# Patient Record
Sex: Male | Born: 1990 | Race: Black or African American | Hispanic: No | Marital: Single | Smoking: Current every day smoker
Health system: Southern US, Community
[De-identification: ages and names within clinical notes are randomized; demographics above are authoritative.]

---

## 2020-06-25 ENCOUNTER — Emergency Department (HOSPITAL_COMMUNITY): Payer: PRIVATE HEALTH INSURANCE

## 2020-06-25 ENCOUNTER — Emergency Department (HOSPITAL_COMMUNITY)
Admission: EM | Admit: 2020-06-25 | Discharge: 2020-06-26 | Disposition: A | Discharge status: Home / Self Care | Payer: PRIVATE HEALTH INSURANCE | Attending: Emergency Medicine | Admitting: Emergency Medicine

## 2020-06-25 ENCOUNTER — Other Ambulatory Visit: Payer: Self-pay

## 2020-06-25 DIAGNOSIS — M545 Low back pain, unspecified: Secondary | ICD-10-CM | POA: Insufficient documentation

## 2020-06-25 DIAGNOSIS — U071 COVID-19: Secondary | ICD-10-CM | POA: Insufficient documentation

## 2020-06-25 DIAGNOSIS — R509 Fever, unspecified: Secondary | ICD-10-CM | POA: Diagnosis present

## 2020-06-25 DIAGNOSIS — R059 Cough, unspecified: Secondary | ICD-10-CM

## 2020-06-25 LAB — RESP PANEL BY RT-PCR (FLU A&B, COVID) ARPGX2
Influenza A by PCR: NEGATIVE
Influenza B by PCR: NEGATIVE
SARS Coronavirus 2 by RT PCR: POSITIVE — AB

## 2020-06-25 MED ORDER — ACETAMINOPHEN 325 MG PO TABS
650.0000 mg | ORAL_TABLET | Freq: Once | ORAL | Status: AC | PRN
  Administered 2020-06-25: 650 mg via ORAL
  Filled 2020-06-25: qty 2

## 2020-06-25 NOTE — ED Triage Notes (Signed)
/  o fever, headache. Symptoms started today.

## 2020-06-26 ENCOUNTER — Encounter (HOSPITAL_COMMUNITY): Payer: Self-pay | Admitting: Student

## 2020-06-26 MED ORDER — ACETAMINOPHEN 500 MG PO TABS
1000.0000 mg | ORAL_TABLET | Freq: Once | ORAL | Status: AC
  Administered 2020-06-26: 1000 mg via ORAL
  Filled 2020-06-26: qty 2

## 2020-06-26 MED ORDER — KETOROLAC TROMETHAMINE 15 MG/ML IJ SOLN
60.0000 mg | Freq: Once | INTRAMUSCULAR | Status: AC
  Administered 2020-06-26: 60 mg via INTRAMUSCULAR
  Filled 2020-06-26: qty 4

## 2020-06-26 MED ORDER — BENZONATATE 100 MG PO CAPS: 100.0000 mg | ORAL_CAPSULE | Freq: Three times a day (TID) | ORAL | 0 refills | Status: AC

## 2020-06-26 MED ORDER — NAPROXEN 500 MG PO TABS: 500.0000 mg | ORAL_TABLET | Freq: Two times a day (BID) | ORAL | 0 refills | Status: AC | PRN

## 2020-06-26 NOTE — ED Provider Notes (Signed)
[facility] EMERGENCY DEPARTMENT Provider Note   CSN: [id number] Arrival date & time: 06/25/20  1758     History Chief Complaint  Patient presents with  . Fever  . Headache    Norman Levy is a 30 y.o. male who presents to the emergency department with complaints of fever that began yesterday.  Patient states he has felt poorly with fever, chills, headache, sore throat, as well as generalized body ache/soreness that is especially worse in his lower back.  Relays his breathing seems somewhat heavy but not specifically short of breath. No alleviating or aggravating factors to his symptoms. Denies N/V/D, abdominal pain, syncope, leg swelling, hemoptysis, recent surgery/trauma, recent long travel, hormone use, personal hx of cancer, or hx of DVT/PE.  Denies visual disturbance, numbness, tingling, weakness, or dizziness like the room spinning.   HPI     No past medical history on file.  There are no problems to display for this patient.   History reviewed. No pertinent surgical history.     No family history on file.     Home Medications Prior to Admission medications   Not on File    Allergies    Patient has no known allergies.  Review of Systems   Review of Systems  Constitutional: Positive for chills and fever.  HENT: Positive for sore throat.   Eyes: Negative for visual disturbance.  Respiratory: Positive for cough (minimal) and shortness of breath.   Cardiovascular: Negative for leg swelling.  Gastrointestinal: Negative for abdominal pain, diarrhea, nausea and vomiting.  Musculoskeletal: Positive for arthralgias and myalgias.  Neurological: Positive for headaches. Negative for dizziness, seizures, syncope, speech difficulty, weakness and numbness.    Physical Exam Updated Vital Signs BP (!) 142/88 (BP Location: Right Arm)   Pulse 88   Temp (!) 101.2 F (38.4 C) (Oral) Comment: RN aware  Resp 18   Ht 5\' 11"  (1.803 m)   Wt 104.3 kg    SpO2 95%   BMI 32.08 kg/m   Physical Exam Vitals and nursing note reviewed.  Constitutional:      General: He is not in acute distress.    Appearance: He is well-developed. He is not toxic-appearing.  HENT:     Head: Normocephalic and atraumatic.     Right Ear: Ear canal normal. Tympanic membrane is not perforated, erythematous, retracted or bulging.     Left Ear: Ear canal normal. Tympanic membrane is not perforated, erythematous, retracted or bulging.     Ears:     Comments: No mastoid erythema/swellng/tenderness.     Nose:     Right Sinus: No maxillary sinus tenderness or frontal sinus tenderness.     Left Sinus: No maxillary sinus tenderness or frontal sinus tenderness.     Mouth/Throat:     Pharynx: Oropharynx is clear. Uvula midline. No oropharyngeal exudate or posterior oropharyngeal erythema.     Comments: Posterior oropharynx is symmetric appearing. Patient tolerating own secretions without difficulty. No trismus. No drooling. No hot potato voice. No swelling beneath the tongue, submandibular compartment is soft.  Eyes:     General:        Right eye: No discharge.        Left eye: No discharge.     Conjunctiva/sclera: Conjunctivae normal.  Cardiovascular:     Rate and Rhythm: Normal rate and regular rhythm.  Pulmonary:     Effort: Pulmonary effort is normal. No respiratory distress.     Breath sounds: Normal breath sounds. No wheezing,  rhonchi or rales.  Abdominal:     General: There is no distension.     Palpations: Abdomen is soft.     Tenderness: There is no abdominal tenderness.  Musculoskeletal:     Cervical back: Neck supple. No rigidity.     Comments: No lower extremity edema.  Lymphadenopathy:     Cervical: No cervical adenopathy.  Skin:    General: Skin is warm and dry.     Findings: No rash.  Neurological:     Mental Status: He is alert.     Comments: Alert.  Clear speech.  CN III to XII grossly intact.  Sensation and strength grossly intact bilateral  upper and lower extremities.  Patient is ambulatory.  Psychiatric:        Behavior: Behavior normal.     ED Results / Procedures / Treatments   Labs (all labs ordered are listed, but only abnormal results are displayed) Labs Reviewed  RESP PANEL BY RT-PCR (FLU A&B, COVID) ARPGX2 - Abnormal; Notable for the following components:      Result Value   SARS Coronavirus 2 by RT PCR POSITIVE (*)    All other components within normal limits    EKG None  Radiology DG Chest Port 1 View  Result Date: 06/25/2020 CLINICAL DATA:  Cough and fever EXAM: PORTABLE CHEST 1 VIEW COMPARISON:  None. FINDINGS: Lungs are clear. Heart size and pulmonary vascularity are normal. No adenopathy. No bone lesions. IMPRESSION: Lungs clear.  Cardiac silhouette normal. Electronically Signed   By: Lowella Grip III M.D.   On: 06/25/2020 20:30    Procedures Procedures (including critical care time)  Medications Ordered in ED Medications  acetaminophen (TYLENOL) tablet 1,000 mg (has no administration in time range)  ketorolac (TORADOL) 15 MG/ML injection 60 mg (has no administration in time range)  acetaminophen (TYLENOL) tablet 650 mg (650 mg Oral Given 06/25/20 1849)    ED Course  I have reviewed the triage vital signs and the nursing notes.  Pertinent labs & imaging results that were available during my care of the patient were reviewed by me and considered in my medical decision making (see chart for details).    Norman Levy was evaluated in Emergency Department on 06/26/2020 for the symptoms described in the history of present illness. He/she was evaluated in the context of the global COVID-19 pandemic, which necessitated consideration that the patient might be at risk for infection with the SARS-CoV-2 virus that causes COVID-19. Institutional protocols and algorithms that pertain to the evaluation of patients at risk for COVID-19 are in a state of rapid change based on information released by regulatory  bodies including the CDC and federal and state organizations. These policies and algorithms were followed during the patient's care in the ED.  MDM Rules/Calculators/A&P                         Patient presents to the ED with complaints of generally not feeling well since yesterday. Nontoxic, febrile, BP somewhat elevated- doubt HTN emergency.  Will give Tylenol for fever as well as her pain and Toradol for additional pain control.  Additional history obtained:  Additional history obtained from nursing note review.   Lab Tests:  I reviewed and interpreted labs, which included:  COVID testing: Positive.   Imaging Studies ordered:  CXR ordered per triage, I independently visualized and interpreted imaging which showed no acute process.   ED Course:  Exam is without signs of  AOM, AOE, or mastoiditis. Oropharyngeal exam is benign.. No sinus tenderness. No meningeal signs. No neuro deficits. Lungs are CTA without focal adventitious sounds, no signs of increased work of breathing, CXR without infiltrate, doubt CAP. Abdomen nontender w/o peritoneal signs. No signs of respiratory distress, not hypoxic.  Suspect patient is symptomatic from COVID-19.  Appears appropriate for discharge home with supportive care. I discussed results, treatment plan, need for follow-up, and return precautions with the patient. Provided opportunity for questions, patient confirmed understanding and is in agreement with plan.   Portions of this note were generated with Scientist, clinical (histocompatibility and immunogenetics). Dictation errors may occur despite best attempts at proofreading.   Final Clinical Impression(s) / ED Diagnoses Final diagnoses:  COVID-19    Rx / DC Orders ED Discharge Orders         Ordered    benzonatate (TESSALON) 100 MG capsule  Every 8 hours        06/26/20 0628    naproxen (NAPROSYN) 500 MG tablet  2 times daily PRN        06/26/20 0628           Cherly Anderson, PA-C 06/26/20 1587    Sabas Sous, MD 06/26/20 [phone]

## 2020-06-26 NOTE — Discharge Instructions (Signed)
Your COVID-19 test was positive, we suspect this is the cause of your symptoms.  We are sending home the following medicines: -Tessalon: Take every hours as needed for coughing  - Naproxen is a nonsteroidal anti-inflammatory medication that will help with pain and swelling. Be sure to take this medication as prescribed with food, 1 pill every 12 hours,  It should be taken with food, as it can cause stomach upset, and more seriously, stomach bleeding. Do not take other nonsteroidal anti-inflammatory medications with this such as Advil, Motrin, Aleve, Mobic, Goodie Powder, or Motrin.    You make take Tylenol per over the counter dosing with these medications for pain & fevers.    We have prescribed you new medication(s) today. Discuss the medications prescribed today with your pharmacist as they can have adverse effects and interactions with your other medicines including over the counter and prescribed medications. Seek medical evaluation if you start to experience new or abnormal symptoms after taking one of these medicines, seek care immediately if you start to experience difficulty breathing, feeling of your throat closing, facial swelling, or rash as these could be indications of a more serious allergic reaction  We are instructing patient's with COVID 19 or symptoms of COVID 19 to follow the below instructions regardless of vaccination status:  - Stay home for 5 days. - If you have no symptoms or your symptoms are resolving after 5 days, you can leave your house--> Continue to wear a mask around others for 5 additional days. - If you have a fever, continue to stay home until your fever resolves.  Please follow up with primary care within 3-5 days for re-evaluation- call prior to going to the office to make them aware of your symptoms as some offices are altering their method of seeing patients with COVID 19 symptoms, we have also provided our [place] covid clinic for follow up as well.  Return to  the ER for new or worsening symptoms including but not limited to increased work of breathing, chest pain, passing out, coughing up blood, or any other concerns.       Person Under Monitoring Name: Norman Levy  Location: [address] [place] [address]   Infection Prevention Recommendations for Individuals Confirmed to have, or Being Evaluated for, 2019 Novel Coronavirus (COVID-19) Infection Who Receive Care at Home  Individuals who are confirmed to have, or are being evaluated for, COVID-19 should follow the prevention steps below until a healthcare provider or local or state health department says they can return to normal activities.  Stay home except to get medical care You should restrict activities outside your home, except for getting medical care. Do not go to work, school, or public areas, and do not use public transportation or taxis.  Call ahead before visiting your doctor Before your medical appointment, call the healthcare provider and tell them that you have, or are being evaluated for, COVID-19 infection. This will help the healthcare providers office take steps to keep other people from getting infected. Ask your healthcare provider to call the local or state health department.  Monitor your symptoms Seek prompt medical attention if your illness is worsening (e.g., difficulty breathing). Before going to your medical appointment, call the healthcare provider and tell them that you have, or are being evaluated for, COVID-19 infection. Ask your healthcare provider to call the local or state health department.  Wear a facemask You should wear a facemask that covers your nose and mouth when you are  in the same room with other people and when you visit a healthcare provider. People who live with or visit you should also wear a facemask while they are in the same room with you.  Separate yourself from other people in your home As much as possible, you should  stay in a different room from other people in your home. Also, you should use a separate bathroom, if available.  Avoid sharing household items You should not share dishes, drinking glasses, cups, eating utensils, towels, bedding, or other items with other people in your home. After using these items, you should wash them thoroughly with soap and water.  Cover your coughs and sneezes Cover your mouth and nose with a tissue when you cough or sneeze, or you can cough or sneeze into your sleeve. Throw used tissues in a lined trash can, and immediately wash your hands with soap and water for at least 20 seconds or use an alcohol-based hand rub.  Wash your [facility] your hands often and thoroughly with soap and water for at least 20 seconds. You can use an alcohol-based hand sanitizer if soap and water are not available and if your hands are not visibly dirty. Avoid touching your eyes, nose, and mouth with unwashed hands.   Prevention Steps for Caregivers and Household Members of Individuals Confirmed to have, or Being Evaluated for, COVID-19 Infection Being Cared for in the Home  If you live with, or provide care at home for, a person confirmed to have, or being evaluated for, COVID-19 infection please follow these guidelines to prevent infection:  Follow healthcare providers instructions Make sure that you understand and can help the patient follow any healthcare provider instructions for all care.  Provide for the patients basic needs You should help the patient with basic needs in the home and provide support for getting groceries, prescriptions, and other personal needs.  Monitor the patients symptoms If they are getting sicker, call his or her medical provider and tell them that the patient has, or is being evaluated for, COVID-19 infection. This will help the healthcare providers office take steps to keep other people from getting infected. Ask the healthcare provider to call  the local or state health department.  Limit the number of people who have contact with the patient If possible, have only one caregiver for the patient. Other household members should stay in another home or place of residence. If this is not possible, they should stay in another room, or be separated from the patient as much as possible. Use a separate bathroom, if available. Restrict visitors who do not have an essential need to be in the home.  Keep older adults, very young children, and other sick people away from the patient Keep older adults, very young children, and those who have compromised immune systems or chronic health conditions away from the patient. This includes people with chronic heart, lung, or kidney conditions, diabetes, and cancer.  Ensure good ventilation Make sure that shared spaces in the home have good air flow, such as from an air conditioner or an opened window, weather permitting.  Wash your hands often Wash your hands often and thoroughly with soap and water for at least 20 seconds. You can use an alcohol based hand sanitizer if soap and water are not available and if your hands are not visibly dirty. Avoid touching your eyes, nose, and mouth with unwashed hands. Use disposable paper towels to dry your hands. If not available, use dedicated  cloth towels and replace them when they become wet.  Wear a facemask and gloves Wear a disposable facemask at all times in the room and gloves when you touch or have contact with the patients blood, body fluids, and/or secretions or excretions, such as sweat, saliva, sputum, nasal mucus, vomit, urine, or feces.  Ensure the mask fits over your nose and mouth tightly, and do not touch it during use. Throw out disposable facemasks and gloves after using them. Do not reuse. Wash your hands immediately after removing your facemask and gloves. If your personal clothing becomes contaminated, carefully remove clothing and launder.  Wash your hands after handling contaminated clothing. Place all used disposable facemasks, gloves, and other waste in a lined container before disposing them with other household waste. Remove gloves and wash your hands immediately after handling these items.  Do not share dishes, glasses, or other household items with the patient Avoid sharing household items. You should not share dishes, drinking glasses, cups, eating utensils, towels, bedding, or other items with a patient who is confirmed to have, or being evaluated for, COVID-19 infection. After the person uses these items, you should wash them thoroughly with soap and water.  Wash laundry thoroughly Immediately remove and wash clothes or bedding that have blood, body fluids, and/or secretions or excretions, such as sweat, saliva, sputum, nasal mucus, vomit, urine, or feces, on them. Wear gloves when handling laundry from the patient. Read and follow directions on labels of laundry or clothing items and detergent. In general, wash and dry with the warmest temperatures recommended on the label.  Clean all areas the individual has used often Clean all touchable surfaces, such as counters, tabletops, doorknobs, bathroom fixtures, toilets, phones, keyboards, tablets, and bedside tables, every day. Also, clean any surfaces that may have blood, body fluids, and/or secretions or excretions on them. Wear gloves when cleaning surfaces the patient has come in contact with. Use a diluted bleach solution (e.g., dilute bleach with 1 part bleach and 10 parts water) or a household disinfectant with a label that says EPA-registered for coronaviruses. To make a bleach solution at home, add 1 tablespoon of bleach to 1 quart (4 cups) of water. For a larger supply, add  cup of bleach to 1 gallon (16 cups) of water. Read labels of cleaning products and follow recommendations provided on product labels. Labels contain instructions for safe and effective use of the  cleaning product including precautions you should take when applying the product, such as wearing gloves or eye protection and making sure you have good ventilation during use of the product. Remove gloves and wash hands immediately after cleaning.  Monitor yourself for signs and symptoms of illness Caregivers and household members are considered close contacts, should monitor their health, and will be asked to limit movement outside of the home to the extent possible. Follow the monitoring steps for close contacts listed on the symptom monitoring form.   ? If you have additional questions, contact your local health department or call the epidemiologist on call at [phone] (available 24/7). ? This guidance is subject to change. For the most up-to-date guidance from [facility], please refer to their website: [email or web address]

## 2020-07-23 ENCOUNTER — Other Ambulatory Visit: Payer: Self-pay

## 2020-07-23 ENCOUNTER — Ambulatory Visit (HOSPITAL_COMMUNITY)
Admission: EM | Admit: 2020-07-23 | Discharge: 2020-07-23 | Disposition: A | Discharge status: Home / Self Care | Payer: PRIVATE HEALTH INSURANCE | Attending: Family Medicine | Admitting: Family Medicine

## 2020-07-23 ENCOUNTER — Encounter (HOSPITAL_COMMUNITY): Payer: Self-pay | Admitting: *Deleted

## 2020-07-23 DIAGNOSIS — M25531 Pain in right wrist: Secondary | ICD-10-CM

## 2020-07-23 MED ORDER — PREDNISONE 20 MG PO TABS: 40.0000 mg | ORAL_TABLET | Freq: Every day | ORAL | 0 refills | Status: AC

## 2020-07-23 MED ORDER — CYCLOBENZAPRINE HCL 10 MG PO TABS: 10.0000 mg | ORAL_TABLET | Freq: Three times a day (TID) | ORAL | 0 refills | Status: AC | PRN
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
## 2020-07-30 ENCOUNTER — Ambulatory Visit: Payer: PRIVATE HEALTH INSURANCE | Admitting: Surgical

## 2020-07-31 ENCOUNTER — Ambulatory Visit (INDEPENDENT_AMBULATORY_CARE_PROVIDER_SITE_OTHER): Payer: Self-pay

## 2020-07-31 ENCOUNTER — Ambulatory Visit: Payer: PRIVATE HEALTH INSURANCE | Admitting: Orthopedic Surgery

## 2020-07-31 ENCOUNTER — Ambulatory Visit: Payer: PRIVATE HEALTH INSURANCE | Admitting: Surgical

## 2020-07-31 DIAGNOSIS — M25531 Pain in right wrist: Secondary | ICD-10-CM

## 2020-08-08 ENCOUNTER — Encounter: Payer: Self-pay | Admitting: Orthopedic Surgery
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
## 2022-11-28 IMAGING — DX DG CHEST 1V PORT
1 series · 1 of 1 positions shown · non-contrast
Comparison: None.

CLINICAL DATA: Cough and fever

EXAM:
PORTABLE CHEST 1 VIEW

[chest ap]
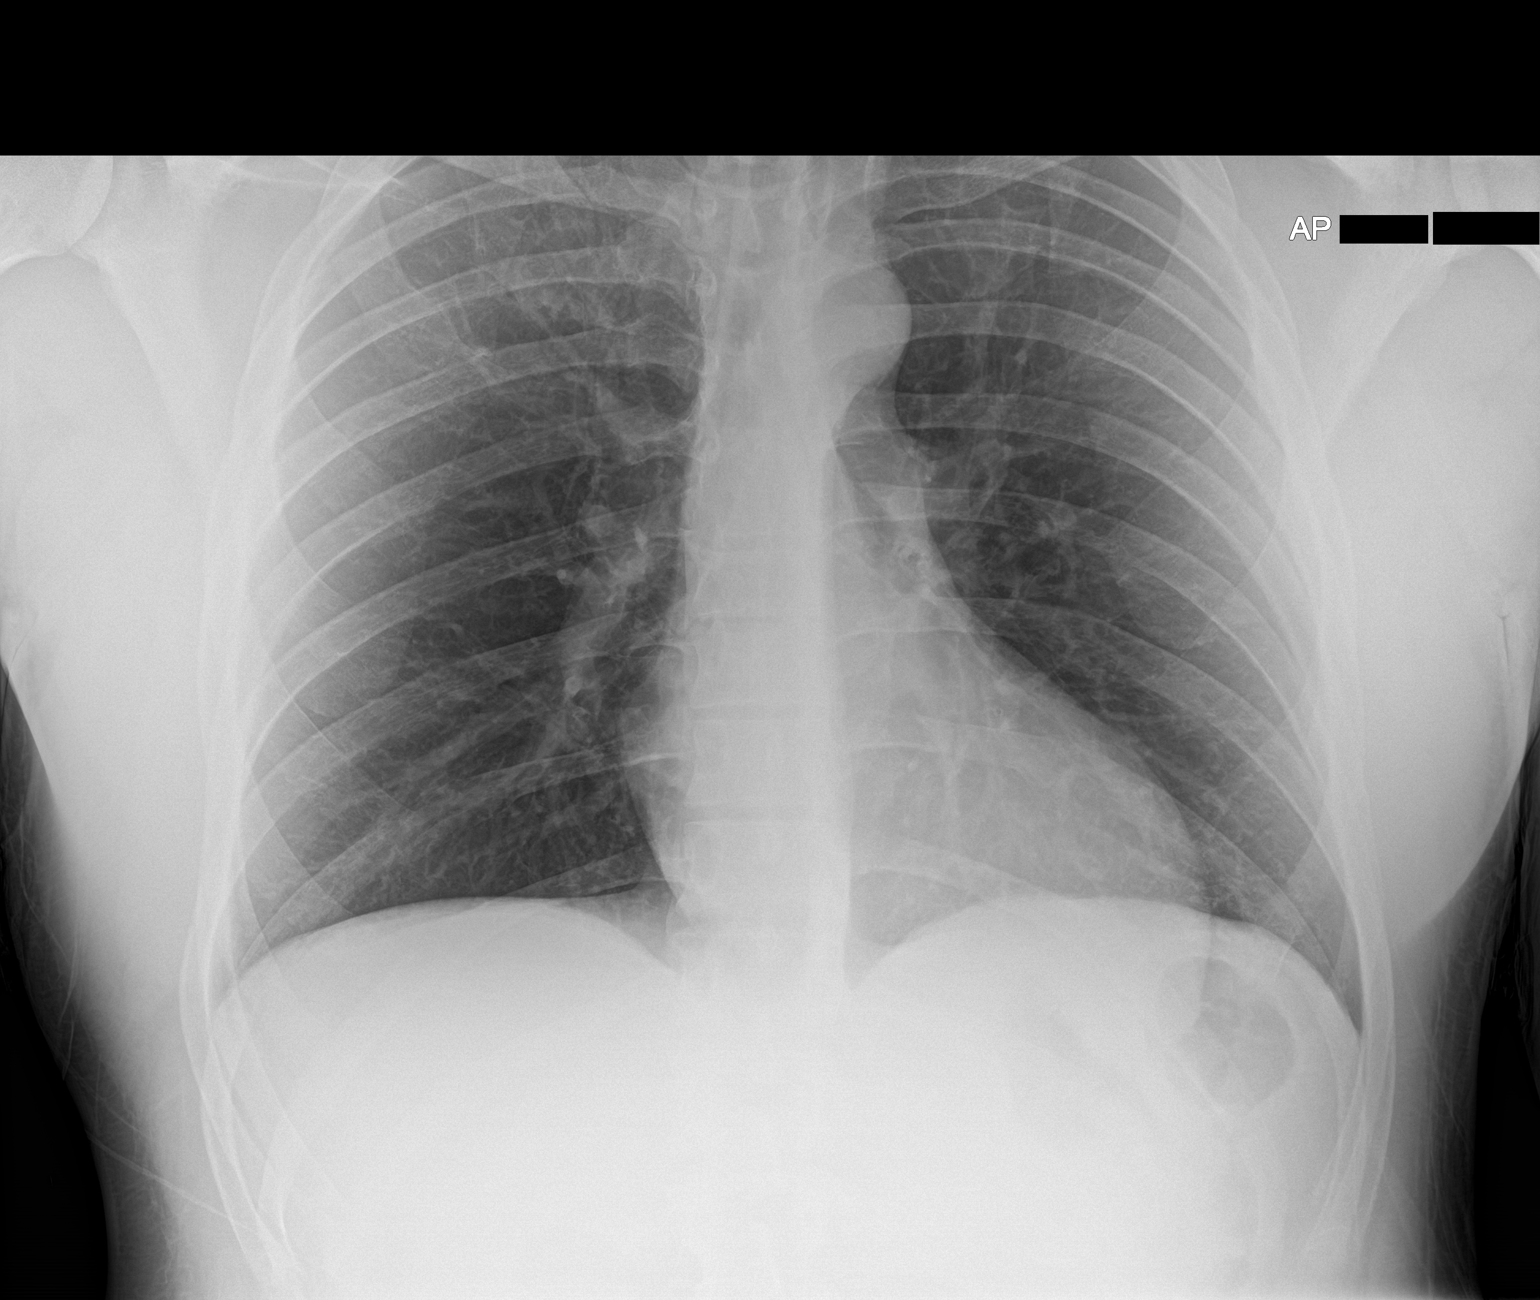

[1 of 1 positions shown; findings below may reference images not displayed]

FINDINGS: Lungs are clear. Heart size and pulmonary vascularity are normal. No
adenopathy. No bone lesions.
IMPRESSION: Lungs clear.  Cardiac silhouette normal.
